# Patient Record
Sex: Male | Born: 2000 | Race: White | Hispanic: No | Marital: Single
Health system: Southern US, Community
[De-identification: ages and names within clinical notes are randomized; demographics above are authoritative.]

---

## 2018-08-16 ENCOUNTER — Ambulatory Visit
Admission: RE | Admit: 2018-08-16 | Discharge: 2018-08-16 | Disposition: A | Discharge status: Home / Self Care | Payer: 59 | Source: Ambulatory Visit | Attending: Sports Medicine | Admitting: Sports Medicine

## 2018-08-16 ENCOUNTER — Other Ambulatory Visit: Payer: Self-pay | Admitting: Sports Medicine

## 2018-08-16 DIAGNOSIS — M25562 Pain in left knee: Secondary | ICD-10-CM

## 2018-08-16 DIAGNOSIS — M25462 Effusion, left knee: Secondary | ICD-10-CM | POA: Insufficient documentation

## 2018-08-24 ENCOUNTER — Ambulatory Visit: Payer: Self-pay

## 2019-09-27 IMAGING — MR MR KNEE*L* W/O CM
7 series · 40 of 40 positions shown · non-contrast
Comparison: None.

CLINICAL DATA: Acute left knee pain and joint effusions since an
injury playing basketball 10 days ago.

EXAM:
MRI OF THE LEFT KNEE WITHOUT CONTRAST
TECHNIQUE: Multiplanar, multisequence MR imaging of the knee was performed. No
intravenous contrast was administered.

[Series 8: T2 fat-sat · axial · left · 4.0mm · 0.50mm/px · z∈[-86,+39]mm · 5 of 26 slices shown (1 of 3)]
[im 1/26]
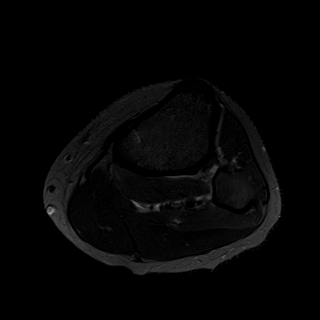
[im 7/26]
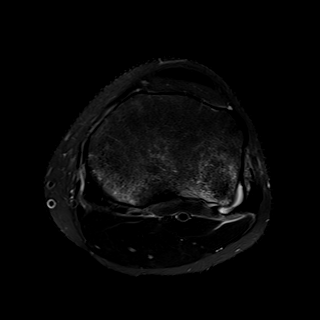
[im 13/26]
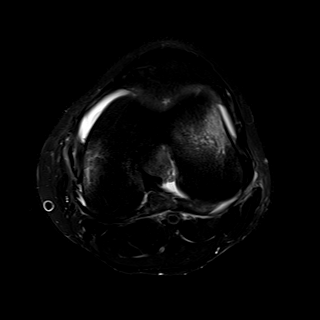
[im 19/26]
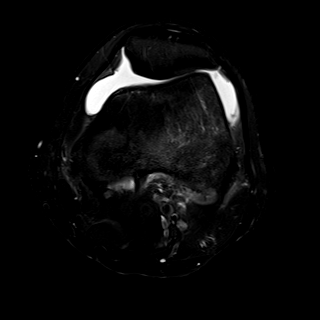
[im 26/26]
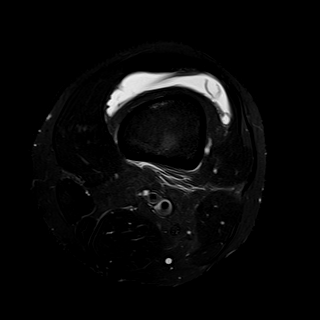

[Series 9: T1 · coronal · left · 4.0mm · 0.59mm/px · 6 of 28 slices shown]
[im 1/28]
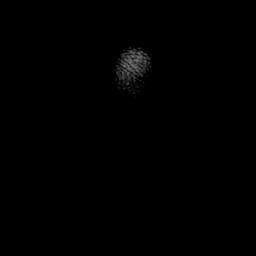
[im 6/28]
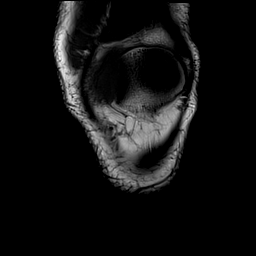
[im 11/28]
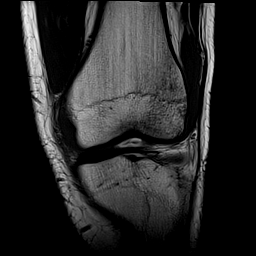
[im 17/28]
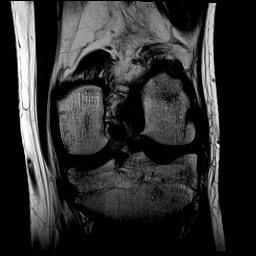
[im 22/28]
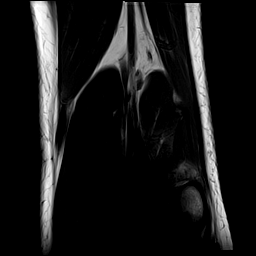
[im 28/28]
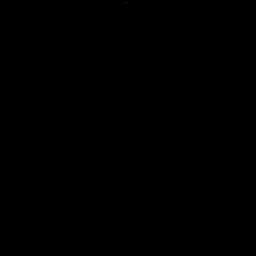

[Series 10: T2 fat-sat · coronal · left · 4.0mm · 0.59mm/px · 6 of 27 slices shown (2 of 3)]
[im 1/27]
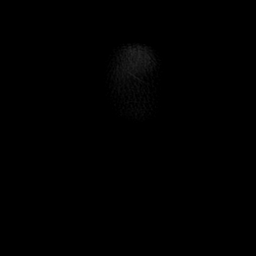
[im 6/27]
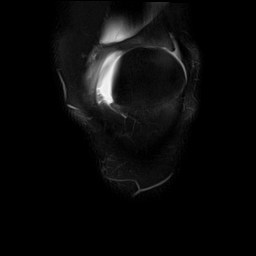
[im 11/27]
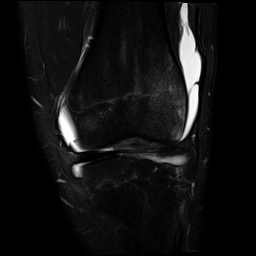
[im 16/27]
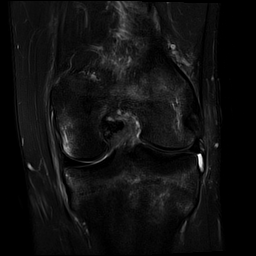
[im 21/27]
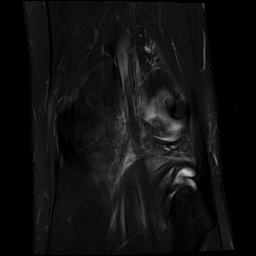
[im 27/27]
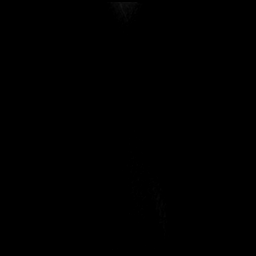

[Series 11: PD fat-sat · coronal · left · 4.0mm · 0.59mm/px · 6 of 28 slices shown (1 of 2)]
[im 1/28]
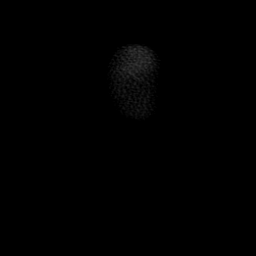
[im 6/28]
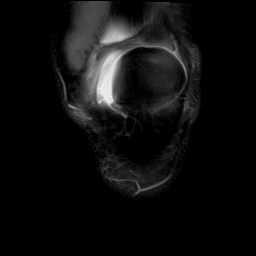
[im 11/28]
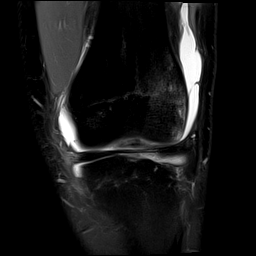
[im 17/28]
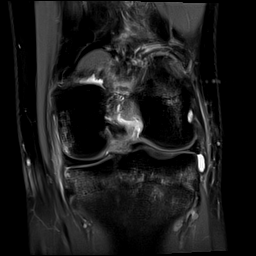
[im 22/28]
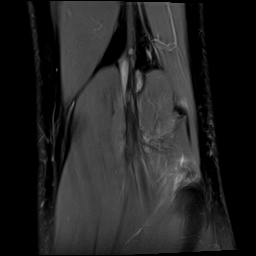
[im 28/28]
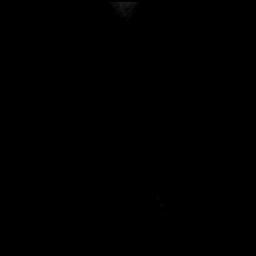

[Series 12: PD fat-sat · sagittal · left · 3.0mm · 0.59mm/px · 7 of 33 slices shown (2 of 2)]
[im 1/33]
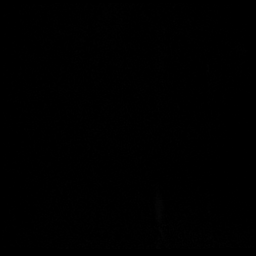
[im 6/33]
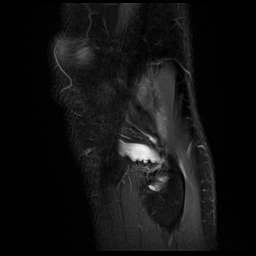
[im 11/33]
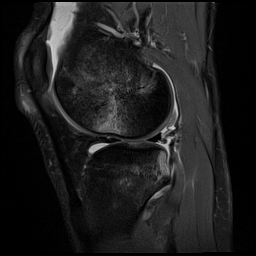
[im 17/33]
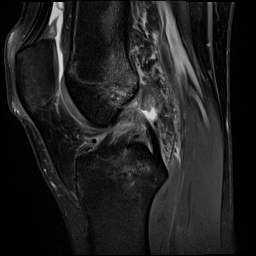
[im 22/33]
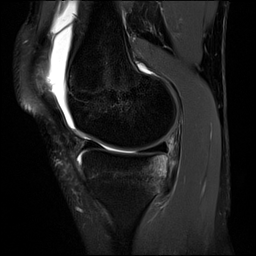
[im 27/33]
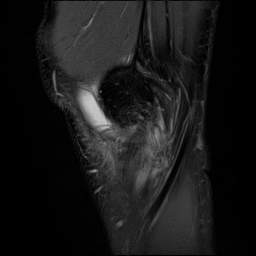
[im 33/33]
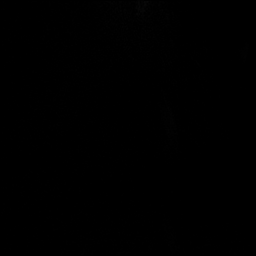

[Series 13: T2 fat-sat · sagittal · left · 3.0mm · 0.59mm/px · 8 of 36 slices shown (3 of 3)]
[im 1/36]
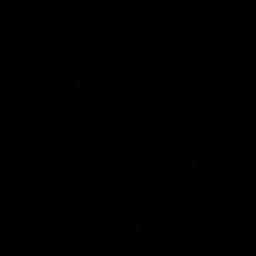
[im 6/36]
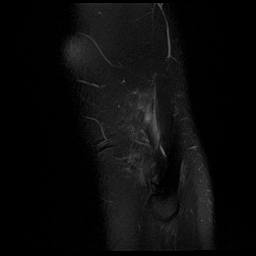
[im 11/36]
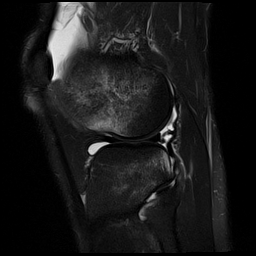
[im 16/36]
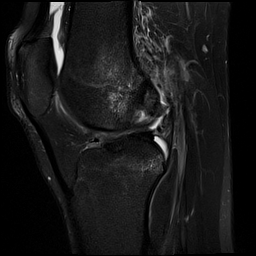
[im 21/36]
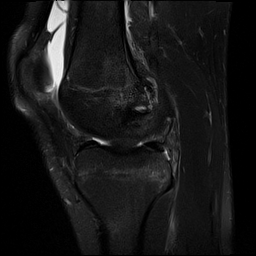
[im 26/36]
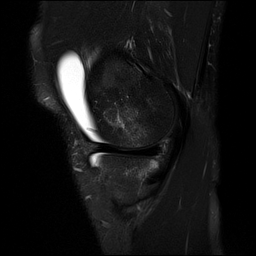
[im 31/36]
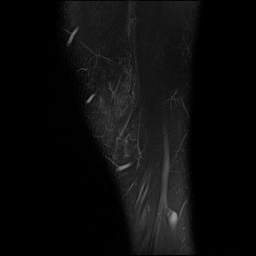
[im 36/36]
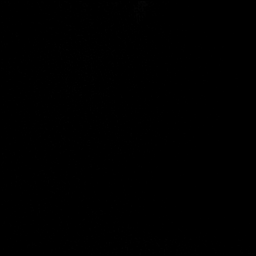

[Series 14: PD · oblique · left · 2.0mm · 0.47mm/px · 2 of 10 slices shown]
[im 1/10]
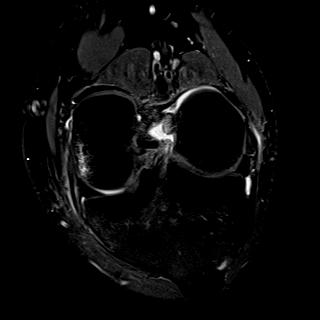
[im 10/10]
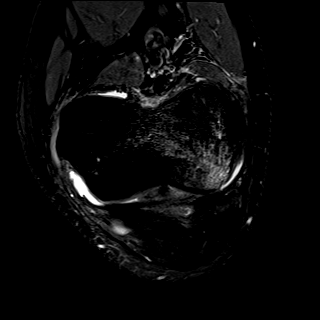

[40 of 40 positions shown; findings below may reference images not displayed]

FINDINGS: MENISCI

Medial meniscus: There is slight edema adjacent to the periphery of
the posterior horn of the medial meniscus without a discrete tear.
This could represent a meniscocapsular injury.

Lateral meniscus:  Normal.

LIGAMENTS

Cruciates: There is a complete tear of the mid anterior cruciate
ligament. PCL is intact.

Collaterals: Edema superficial to the MCL consistent with a grade 1
sprain. LCL is normal.

CARTILAGE

Patellofemoral:  Normal.

Medial:  Normal.

Lateral:  Normal.

Joint: Prominent joint effusion. Slightly thickened suprapatellar
plicae.

Popliteal Fossa: Intact popliteus tendon. No Baker's cyst. Subtle
edema in the popliteus muscle and proximal soleus muscle. There
appears to be disruption of the meniscocapsular ligaments at the
posterolateral corner adjacent to the popliteus tendon.

Extensor Mechanism:  Normal.

Bones: There is a subtle subchondral impaction fracture of the
periphery of the lateral femoral condyle best seen on image 16 of
series 11 and image 26 of series 12. There are bone contusions of
the posterior aspects of the medial and lateral tibial plateaus and
of the periphery of the medial femoral condyle.

Other: None
IMPRESSION: 1. Complete tear of the anterior cruciate ligament with multiple
bone contusions and a subtle sub chondral fracture of the lateral
femoral condyle.
2. Red zone injury of the periphery of the posterior horn of the
medial meniscus.
3. Probable disruption of the meniscocapsular ligaments at the
posterolateral corner.
4. Strains of the popliteus and soleus muscles.
5. Slightly thickened suprapatellar plicae.
6. Prominent joint effusion.

## 2020-07-31 ENCOUNTER — Other Ambulatory Visit: Payer: Self-pay | Admitting: Orthopedic Surgery

## 2020-07-31 DIAGNOSIS — M238X2 Other internal derangements of left knee: Secondary | ICD-10-CM

## 2020-07-31 DIAGNOSIS — M25562 Pain in left knee: Secondary | ICD-10-CM

## 2020-07-31 DIAGNOSIS — M25462 Effusion, left knee: Secondary | ICD-10-CM

## 2020-08-11 ENCOUNTER — Ambulatory Visit: Payer: 59

## 2020-08-19 ENCOUNTER — Other Ambulatory Visit: Payer: Self-pay

## 2020-08-19 ENCOUNTER — Ambulatory Visit
Admission: RE | Admit: 2020-08-19 | Discharge: 2020-08-19 | Disposition: A | Discharge status: Home / Self Care | Payer: 59 | Source: Ambulatory Visit | Attending: Orthopedic Surgery | Admitting: Orthopedic Surgery

## 2020-08-19 DIAGNOSIS — M25562 Pain in left knee: Secondary | ICD-10-CM

## 2020-08-19 DIAGNOSIS — M25462 Effusion, left knee: Secondary | ICD-10-CM | POA: Diagnosis present

## 2020-08-19 DIAGNOSIS — M238X2 Other internal derangements of left knee: Secondary | ICD-10-CM | POA: Diagnosis present

## 2021-03-05 ENCOUNTER — Ambulatory Visit: Payer: BC Managed Care – PPO | Attending: Adult Health | Admitting: Physical Therapy

## 2021-03-05 ENCOUNTER — Other Ambulatory Visit: Payer: Self-pay

## 2021-03-05 VITALS — BP 125/77 | HR 83

## 2021-03-05 DIAGNOSIS — M545 Low back pain, unspecified: Secondary | ICD-10-CM | POA: Insufficient documentation

## 2021-03-05 NOTE — Therapy (Signed)
[facility] [address] [place], [address] Phone: [phone]   Fax:  [phone]  Physical Therapy Evaluation  Patient Details  Name: Gid Schoffstall MRN: [id number] Date of Birth: Jun 18, 2001 Referring Provider (PT): Ellin Goodie NP   Encounter Date: 03/05/2021    History reviewed. No pertinent past medical history.  History reviewed. No pertinent surgical history.  Vitals:   03/05/21 1647  BP: 125/77  Pulse: 83  SpO2: 98%      Subjective Assessment - 03/05/21 1647     Subjective Pt reports that 4 weeks he was lifting/ dead lifts. He noticed that his back started feeling stiff and paining him. The pain has not resolved and he really noticed it when driving down here.    Pertinent History Low back pain    Limitations Sitting;Other (comment)   Bending over also hurts   How long can you sit comfortably? 10-15 minutes    How long can you stand comfortably? Yes    How long can you walk comfortably? Yes    Diagnostic tests X-rays    Currently in Pain? Yes    Pain Score 4     Pain Location Back    Pain Orientation Left    Pain Descriptors / Indicators Aching    Pain Type Acute pain    Pain Radiating Towards Stays on left side of low back    Pain Onset 1 to 4 weeks ago    Pain Frequency Intermittent    Aggravating Factors  Sitting and bending forward    Pain Relieving Factors Laying flat and not moving    Effect of Pain on Daily Activities Limits basketball, sit in class, cannot workout              LOW BACK EVALUATION   - Cauda Equina Syndrome: Negative to all symptoms  Bladder/bowel dysfunction Saddle anesthesia  Sexual dysfunction Possible neurological deficits in the lower limb (motor or sensory loss, reflex change)   AROM (in standing):         Lumbar flexion: 100%*                ext: 100%                SB R/L: 100%* 100%                Rot R/L: 100%   100%  *=painful  AROM:                   R      L          Norms           Hip flexion:  120    120         120             ER:             45    45              45              IR:              20     20             45  PROM:                   R      L  Norms           Hip flexion:  120    120         120             ER:             45    45              45              IR:              20     20             45    Strength: R/L          Hip flex: 5/5   5/5              abd:  5/5     5/5         Knee flex: 5/5  5/5               ext:      5/5  5/5          Ankle DF: 5/5 5/5     Great toe ext: 5/5 5/5    Special Tests:          Slump test: - B          SLR: - B (70 degrees of flexion)         FABERS: - B          FADDIR: - B          Thigh thrust: NT          Thomas Test: - B             Ely's Test: -B            Ober's Test: -B    Palpation: Left paraspinal adjacent to L1 vertebrae  ----------------------------------- Imaging: Pt provided x-rays from chiropractor but no impression was listed.   THEREX:   Seated Butterfly Forward Flexion 2 x 30 sec  Triangle Pose Stretch 2 x 30 sec        PT Education - 03/06/21 0859     Education Details form/technique for appropriate exercise. Explanation of muscle strain and resulting symptoms and pain.    Person(s) Educated Patient    Methods Explanation;Demonstration;Verbal cues;Handout    Comprehension Verbalized understanding;Returned demonstration;Verbal cues required;Tactile cues required             Plan - 03/06/21 0901     Clinical Impression Statement Pt is a 20 yo white male that presents for initial eval for increased left sided low back pain. He demonstrates signs and symptoms of a grade I paraspinal muscle strain with pain and discomfort with activation of muscle, tension with paraspinal, and no noticable swelling and bruising. He will benefit from a limited number of PT visits to create home  exercise and activity modification plan to return to lifting weights and sitting comfortbally for prolonged while attending his college classes.    Examination-Activity Limitations Sit;Other;Lift   Playing sports like basketball   Examination-Participation Restrictions Occupation;Other   Playing sports   Stability/Clinical Decision Making Stable/Uncomplicated    Clinical Decision Making Low    Rehab Potential Excellent    PT Frequency 2x / week    PT Duration 6 weeks   6   PT Treatment/Interventions Therapeutic activities;Therapeutic exercise;Electrical Stimulation;Cryotherapy;Moist Heat;Manual techniques;Dry needling;Spinal  Manipulations    PT Next Visit Plan E-stim/heat, progress stretching or modalities    PT Home Exercise Plan Seated Butterfly Stretch, Triangle Pose    Consulted and Agree with Plan of Care Patient             PT Short Term Goals - 03/06/21 0913       PT SHORT TERM GOAL #1   Title Patient will demonstrate understanding of home exercise plan.    Time 2    Period Weeks    Status New    Target Date 03/20/21             PT Long Term Goals - 03/06/21 0913       PT LONG TERM GOAL #1   Title Patient will demonstrate a decrease in her low back pain tolerating  lifting weights at gym.    Baseline 03/05/21: Unable to lift weights    Time 6    Period Weeks    Status New    Target Date 04/17/21      PT LONG TERM GOAL #2   Title Patient will have improved function and activity level as evidenced by an increase in FOTO score by 10 points or more.    Baseline 03/05/21: 53/75    Time 6    Period Weeks    Status New    Target Date 04/17/21            HEP includes the following:   Access Code: [facility] URL: [email or web address] Date: 03/05/2021 Prepared by: Ellin Goodie  Exercises Triangle Pose - 1 x daily - 7 x weekly - 1 sets - 5 reps - 30 hold Butterfly Forward Lean Stretch 1 x 5 x 30 sec    Patient will benefit from skilled  therapeutic intervention in order to improve the following deficits and impairments:  Pain, Impaired flexibility  Visit Diagnosis: Acute left-sided low back pain without sciatica - Plan: PT plan of care cert/re-cert     Problem List There are no problems to display for this patient.  Ellin Goodie PT, DPT  03/06/2021, 5:10 PM  [facility] [address] [place], [address] Phone: [phone]   Fax:  [phone]  Name: Cleatis Fandrich MRN: [id number] Date of Birth: [birth year]

## 2021-03-06 ENCOUNTER — Encounter: Payer: Self-pay | Admitting: Physical Therapy

## 2021-03-10 ENCOUNTER — Encounter: Payer: Self-pay | Admitting: Physical Therapy

## 2021-03-11 ENCOUNTER — Ambulatory Visit: Payer: BC Managed Care – PPO | Admitting: Physical Therapy

## 2021-03-11 ENCOUNTER — Other Ambulatory Visit: Payer: Self-pay

## 2021-03-11 DIAGNOSIS — M545 Low back pain, unspecified: Secondary | ICD-10-CM | POA: Diagnosis not present

## 2021-03-11 NOTE — Therapy (Addendum)
[facility] [address] [place], [address] Phone: [phone]   Fax:  [phone]  Physical Therapy Treatment/ Discharge Summary   Discharge Summary: Patient has missed several appointments at this point, and he has not responded to repeated calls to reschedule.   Patient Details  Name: Jared Marks MRN: [id number] Date of Birth: 2000/10/23 Referring Provider (PT): Ellin Goodie NP   Encounter Date: 03/11/2021   PT End of Session - 03/12/21 1434     Visit Number 2    Number of Visits 6    Date for PT Re-Evaluation 04/17/21    PT Start Time 1800    PT Stop Time 1845    PT Time Calculation (min) 45 min    Activity Tolerance Patient tolerated treatment well    Behavior During Therapy [facility] for tasks assessed/performed             No past medical history on file.  No past surgical history on file.  There were no vitals filed for this visit.   Subjective Assessment - 03/11/21 1811     Subjective Pt reports that he notices that he is able to sit longer without experiencing pain. He also has been stretching through yoga which has helped. The only issue right now is bending over and sitting.    Pertinent History Low back pain    Limitations Sitting;Other (comment)   Bending over also hurts   How long can you sit comfortably? 10-15 minutes    How long can you stand comfortably? Yes    How long can you walk comfortably? Yes    Diagnostic tests X-rays    Currently in Pain? Yes    Pain Score 3     Pain Location Back    Pain Onset 1 to 4 weeks ago            THEREX   Single Leg Ankle Touches 2 x 10  Lateral Step Downs 2 x 10  Bridges 3 x 10  Ball Rolls AAROM Left, Right, Middle 3 x 10  Bridges with Marches 2 x 10    Updated HEP and educated patient on changes to exercises and addition of new exercises SL ankle touches and bridging marches.                 PT Education -  03/12/21 1434     Education Details form/technique for appropriate exercise. Explanation of stopping exercise if it causes excessive pain    Person(s) Educated Patient    Methods Explanation;Demonstration;Verbal cues;Handout    Comprehension Verbalized understanding;Verbal cues required;Returned demonstration              PT Short Term Goals - 03/12/21 1438       PT SHORT TERM GOAL #1   Title Patient will demonstrate understanding of home exercise plan.    Time 2    Period Weeks    Status New    Target Date 03/20/21               PT Long Term Goals - 03/12/21 1438       PT LONG TERM GOAL #1   Title Patient will demonstrate a decrease in her low back pain tolerating  lifting weights at gym.    Baseline 03/05/21: Unable to lift weights    Time 6    Period Weeks    Status New      PT LONG TERM GOAL #2  Title Patient will have improved function and activity level as evidenced by an increase in FOTO score by 10 points or more.    Baseline 03/05/21: 53/75    Time 6    Period Weeks    Status New            HEP includes following:  Access Code: [email or web address] URL: [email or web address] Date: 03/11/2021 Prepared by: Ellin Goodie  Exercises Triangle Pose - 1 x daily - 3 x weekly - 3 sets - 10 reps Supine Bridge - 1 x daily - 3 x weekly - 3 sets - 10 reps Lateral Step Down - 1 x daily - 3 x weekly - 3 sets - 10 reps Marching Bridge - 1 x daily - 3 x weekly - 2 sets - 10 reps     Patient will benefit from skilled therapeutic intervention in order to improve the following deficits and impairments:  Pain, Impaired flexibility  Visit Diagnosis: Acute left-sided low back pain without sciatica     Problem List There are no problems to display for this patient.  Ellin Goodie PT, DPT  03/12/2021, 2:40 PM  [facility] SPORTS MEDICINE [address], [place], [address] Phone: [phone]    Fax:  [phone]  Name: Jared Marks MRN: [id number] Date of Birth: 18-Dec-2000

## 2021-03-12 ENCOUNTER — Encounter: Payer: Self-pay | Admitting: Physical Therapy

## 2021-03-17 ENCOUNTER — Telehealth: Payer: Self-pay | Admitting: Physical Therapy

## 2021-03-17 ENCOUNTER — Ambulatory Visit: Payer: BC Managed Care – PPO | Admitting: Physical Therapy

## 2021-03-17 NOTE — Telephone Encounter (Signed)
Called pt to inquiry about his absence. Did not reach so left VM to call back to inform office about his absence and to reschedule.

## 2021-03-19 ENCOUNTER — Encounter: Payer: Self-pay | Admitting: Physical Therapy

## 2021-03-24 ENCOUNTER — Encounter: Payer: Self-pay | Admitting: Physical Therapy

## 2021-03-25 ENCOUNTER — Ambulatory Visit: Payer: BC Managed Care – PPO | Admitting: Physical Therapy

## 2021-03-26 ENCOUNTER — Encounter: Payer: Self-pay | Admitting: Physical Therapy

## 2021-03-31 ENCOUNTER — Ambulatory Visit: Payer: PRIVATE HEALTH INSURANCE | Admitting: Physical Therapy

## 2021-03-31 ENCOUNTER — Telehealth: Payer: Self-pay | Admitting: Physical Therapy

## 2021-03-31 NOTE — Telephone Encounter (Signed)
Called pt to inquiry about repeated absences and to inform him that he could cancel remainder of apts if he no longer needed therapy.

## 2021-04-02 ENCOUNTER — Ambulatory Visit: Payer: PRIVATE HEALTH INSURANCE | Attending: Adult Health | Admitting: Physical Therapy

## 2021-04-07 ENCOUNTER — Ambulatory Visit: Payer: PRIVATE HEALTH INSURANCE | Admitting: Physical Therapy

## 2021-04-07 ENCOUNTER — Telehealth: Payer: Self-pay

## 2021-04-07 NOTE — Telephone Encounter (Signed)
Contacted pt after lunch to confirm appointment time. Left a voicemail, encouraged pt to touch base about continuing on or discharging. Pt has >5 future appointments remains, but has only been seen twice. Pt has canceled 6 visits and been a no-show for 2 others.   5:26 PM, 04/07/21 Rosamaria Lints, PT, DPT Physical Therapist - [place] [phone] (Office)

## 2021-04-09 ENCOUNTER — Ambulatory Visit: Payer: PRIVATE HEALTH INSURANCE | Admitting: Physical Therapy

## 2021-04-14 ENCOUNTER — Encounter: Payer: Self-pay | Admitting: Physical Therapy

## 2021-04-16 ENCOUNTER — Encounter: Payer: Self-pay | Admitting: Physical Therapy

## 2021-04-20 ENCOUNTER — Encounter: Payer: Self-pay | Admitting: Physical Therapy

## 2021-04-22 ENCOUNTER — Encounter: Payer: Self-pay | Admitting: Physical Therapy

## 2021-04-27 ENCOUNTER — Encounter: Payer: Self-pay | Admitting: Physical Therapy

## 2021-04-29 ENCOUNTER — Encounter: Payer: Self-pay | Admitting: Physical Therapy
